# Patient Record
Sex: Female | Born: 1985 | Race: Black or African American | Hispanic: No | Marital: Married | Smoking: Never smoker
Health system: Southern US, Community
[De-identification: ages and names within clinical notes are randomized; demographics above are authoritative.]

---
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
## 2008-02-03 ENCOUNTER — Emergency Department (HOSPITAL_COMMUNITY): Admission: EM | Admit: 2008-02-03 | Discharge: 2008-02-03 | Payer: Self-pay | Admitting: Emergency Medicine

## 2009-08-18 ENCOUNTER — Emergency Department (HOSPITAL_COMMUNITY): Admission: EM | Admit: 2009-08-18 | Discharge: 2009-08-18 | Payer: Self-pay | Admitting: Emergency Medicine

## 2011-03-07 LAB — WET PREP, GENITAL
Trich, Wet Prep: NONE SEEN
Yeast Wet Prep HPF POC: NONE SEEN

## 2011-03-07 LAB — GC/CHLAMYDIA PROBE AMP, GENITAL
Chlamydia, DNA Probe: NEGATIVE
GC Probe Amp, Genital: POSITIVE — AB

## 2011-03-07 LAB — RPR: RPR Ser Ql: NONREACTIVE

## 2011-03-07 LAB — POCT PREGNANCY, URINE: Preg Test, Ur: NEGATIVE

## 2016-12-12 ENCOUNTER — Encounter (HOSPITAL_COMMUNITY): Payer: Self-pay | Admitting: Emergency Medicine

## 2016-12-12 ENCOUNTER — Emergency Department (HOSPITAL_COMMUNITY)
Admission: EM | Admit: 2016-12-12 | Discharge: 2016-12-12 | Disposition: A | Discharge status: Home / Self Care | Payer: Self-pay | Attending: Emergency Medicine | Admitting: Emergency Medicine

## 2016-12-12 DIAGNOSIS — L02411 Cutaneous abscess of right axilla: Secondary | ICD-10-CM | POA: Insufficient documentation

## 2016-12-12 MED ORDER — LIDOCAINE-EPINEPHRINE (PF) 2 %-1:200000 IJ SOLN
10.0000 mL | Freq: Once | INTRAMUSCULAR | Status: AC
  Administered 2016-12-12: 10 mL
  Filled 2016-12-12: qty 20

## 2016-12-12 MED ORDER — IBUPROFEN 400 MG PO TABS
600.0000 mg | ORAL_TABLET | Freq: Once | ORAL | Status: AC
  Administered 2016-12-12: 600 mg via ORAL
  Filled 2016-12-12: qty 1

## 2016-12-12 NOTE — ED Provider Notes (Signed)
[facility]-EMERGENCY DEPT Provider Note   CSN: [id number] Arrival date & time: 12/12/16  1443  By signing my name below, I, Rosana Fretana Waskiewicz, attest that this documentation has been prepared under the direction and in the presence of non-physician practitioner, Russo, [place], PA-C. Electronically Signed: Rosana Fretana Waskiewicz, ED Scribe. 12/12/16. 5:55 PM.  History   Chief Complaint Chief Complaint  Patient presents with  . Abscess   The history is provided by the patient. No language interpreter was used.   HPI Comments: Alice Bean is a 31 y.o. female who presents to the Emergency Department complaining of a moderate, gradually worsening area of pain and swelling to the right axilla onset 2 weeks ago. Pt has a hx of abscesses in the same area. Pt states pain is exacerbated with palpation and direct pressure. Pt denies hx hidradenitis suppurativa. Denies fever, chills, nausea, vomiting purulent drainage from the area.   History reviewed. No pertinent past medical history.  There are no active problems to display for this patient.   History reviewed. No pertinent surgical history.  OB History    No data available       Home Medications    Prior to Admission medications   Not on File    Family History No family history on file.  Social History Social History  Substance Use Topics  . Smoking status: Never Smoker  . Smokeless tobacco: Never Used  . Alcohol use No     Allergies   Patient has no allergy information on record.   Review of Systems Review of Systems  Constitutional: Negative for chills and fever.  Gastrointestinal: Negative for nausea and vomiting.  Skin: Positive for wound.     Physical Exam Updated Vital Signs BP 109/63   Pulse 67   Temp 98.9 F (37.2 C) (Oral)   Resp 16   Ht 5\' 7"  (1.702 m)   Wt 100.5 kg (221 lb 8 oz)   LMP 09/07/2016 Comment: pregnant  SpO2 100%   BMI 34.69 kg/m   Physical Exam  Constitutional: She appears well-developed  and well-nourished. No distress.  Pt is well-appearing.  HENT:  Head: Normocephalic and atraumatic.  Eyes: Conjunctivae are normal.  Cardiovascular: Normal rate.   Pulmonary/Chest: Effort normal.  Neurological: She is alert.  Skin: Skin is warm and dry. No erythema.  3-4 cm fluctuant tender mass in the right axilla. No surrounding erythema or edema. Able to express some purulent drainage.  Psychiatric: She has a normal mood and affect. Her behavior is normal.  Nursing note and vitals reviewed.    ED Treatments / Results  DIAGNOSTIC STUDIES: Oxygen Saturation is 100% on RA, normal by my interpretation.   COORDINATION OF CARE: 5:53 PM-Discussed next steps with pt including U/S. Pt verbalized understanding and is agreeable with the plan.   Labs (all labs ordered are listed, but only abnormal results are displayed) Labs Reviewed - No data to display  EKG  EKG Interpretation None       Radiology No results found.  Procedures EMERGENCY DEPARTMENT US SOFT TISSUE INTERPRETATION "Study: Limited Soft Tissue Ultrasound"  INDICATIONS: Pain and Soft tissue infection Multiple views of the body part were obtained in real-time with a multi-frequency linear probe  PERFORMED BY: Myself IMAGES ARCHIVED?: Yes SIDE:Right  BODY PART:Axilla INTERPRETATION:  Abcess present and No cellulitis noted     .Marland Kitchen.Incision and Drainage Date/Time: 12/12/2016 7:32 PM Performed by: RUSSO, [place]ORDAN N Authorized by: RUSSO, [place]ORD[place] N   Consent:    Consent obtained:  Verbal   Consent given by:  Patient   Risks discussed:  Bleeding, incomplete drainage, infection and pain   Alternatives discussed:  No treatment and delayed treatment Location:    Type:  Abscess   Size:  3 cm   Location:  Upper extremity   Upper extremity location:  Arm   Arm location: right axilla. Pre-procedure details:    Skin preparation:  Chloraprep Anesthesia (see MAR for exact dosages):    Anesthesia method:  Local  infiltration   Local anesthetic:  Lidocaine 2% WITH epi Procedure type:    Complexity:  Complex Procedure details:    Needle aspiration: yes     Needle gauge: 27.   Incision types:  Single straight   Incision depth:  Dermal   Scalpel blade:  11   Wound management:  Probed and deloculated and irrigated with saline   Drainage:  Bloody and purulent   Drainage amount:  Moderate   Wound treatment:  Wound left open   Packing materials:  1/4 in iodoform gauze   Amount 1/4" iodoform:  3 cm Post-procedure details:    Patient tolerance of procedure:  Tolerated well, no immediate complications    (including critical care time)  Medications Ordered in ED Medications  lidocaine-EPINEPHrine (XYLOCAINE W/EPI) 2 %-1:200000 (PF) injection 10 mL (10 mLs Infiltration Given by Other 12/12/16 1936)  ibuprofen (ADVIL,MOTRIN) tablet 600 mg (600 mg Oral Given 12/12/16 1855)     Initial Impression / Assessment and Plan / ED Course  I have reviewed the triage vital signs and the nursing notes.  Pertinent labs & imaging results that were available during my care of the patient were reviewed by me and considered in my medical decision making (see chart for details).    Patient with skin abscess to right axilla. Incision and drainage performed in the ED today.  Abscess was packed due to location in a skin fold. Wound recheck in 2 days. Supportive care and return precautions discussed. Pt is afebrile, nontoxic, not in distress,  safe for discharge.   Discussed results, findings, treatment and follow up. Patient advised of return precautions. Patient verbalized understanding and agreed with plan.  Final Clinical Impressions(s) / ED Diagnoses   Final diagnoses:  Abscess of axilla, right    New Prescriptions There are no discharge medications for this patient.  I personally performed the services described in this documentation, which was scribed in my presence. The recorded information has been  reviewed and is accurate.     Russo, [place] N, PA-C 12/12/16 2000    Vanetta Mulders, MD 12/14/16 [phone]

## 2016-12-12 NOTE — ED Triage Notes (Signed)
Pt. Stated, I have this knot or something under rt, arm for over a month. Ive had an abscess before.

## 2016-12-12 NOTE — Discharge Instructions (Signed)
Please read instructions below.  Keep your wound clean, dry and covered for the first 24 hours.  After the first 24 hours, apply warm compresses and do warm water soaks frequently.  Follow up with urgent care or ER for wound recheck in 2-3 days. You can take advil or tylenol as needed for pain. Return to the ER for fever, redness, or new or worsening symptoms.

## 2017-01-18 ENCOUNTER — Encounter (HOSPITAL_COMMUNITY): Payer: Self-pay

## 2017-01-18 ENCOUNTER — Emergency Department (HOSPITAL_COMMUNITY): Payer: Self-pay

## 2017-01-18 ENCOUNTER — Emergency Department (HOSPITAL_COMMUNITY)
Admission: EM | Admit: 2017-01-18 | Discharge: 2017-01-18 | Disposition: A | Discharge status: Home / Self Care | Payer: Self-pay | Attending: Emergency Medicine | Admitting: Emergency Medicine

## 2017-01-18 DIAGNOSIS — Y998 Other external cause status: Secondary | ICD-10-CM | POA: Insufficient documentation

## 2017-01-18 DIAGNOSIS — Z3A19 19 weeks gestation of pregnancy: Secondary | ICD-10-CM | POA: Insufficient documentation

## 2017-01-18 DIAGNOSIS — W19XXXA Unspecified fall, initial encounter: Secondary | ICD-10-CM

## 2017-01-18 DIAGNOSIS — Y9389 Activity, other specified: Secondary | ICD-10-CM | POA: Insufficient documentation

## 2017-01-18 DIAGNOSIS — S8001XA Contusion of right knee, initial encounter: Secondary | ICD-10-CM | POA: Insufficient documentation

## 2017-01-18 DIAGNOSIS — S8011XA Contusion of right lower leg, initial encounter: Secondary | ICD-10-CM | POA: Insufficient documentation

## 2017-01-18 DIAGNOSIS — W010XXA Fall on same level from slipping, tripping and stumbling without subsequent striking against object, initial encounter: Secondary | ICD-10-CM | POA: Insufficient documentation

## 2017-01-18 DIAGNOSIS — O9A212 Injury, poisoning and certain other consequences of external causes complicating pregnancy, second trimester: Secondary | ICD-10-CM | POA: Insufficient documentation

## 2017-01-18 DIAGNOSIS — Y92009 Unspecified place in unspecified non-institutional (private) residence as the place of occurrence of the external cause: Secondary | ICD-10-CM | POA: Insufficient documentation

## 2017-01-18 NOTE — ED Notes (Signed)
Pt departed in NAD, refused use of wheelchair.  

## 2017-01-18 NOTE — Discharge Instructions (Signed)
It was my pleasure taking care of you today!   Ice affected area as needed for pain. If you need additional pain control, you can take Tylenol.  Keep your follow up appointment with OBGYN.   Return to ER for new or worsening symptoms, any additional concerns.

## 2017-01-18 NOTE — ED Provider Notes (Signed)
[facility] Provider Note   CSN: [id number] Arrival date & time: 01/18/17  1248     History   Chief Complaint Chief Complaint  Patient presents with  . Fall  . Pregnant, 19 weeks    HPI Alice Bean is a 31 y.o. female.  The history is provided by the patient and medical records. No language interpreter was used.  Fall    Alice Bean is a 31 y.o. female who presents to the Emergency Department complaining of right leg pain which began acutely this morning. Patient states that she woke up around 4 am this morning to feed her child and got her right foot stuck in a vent, causing her to fall forward, hitting her right lower leg and right knee. She hit lower extremity first, but then did land on abdomen. Of note, patient is [redacted] weeks pregnant. Thus far, pregnancy with no complications and going smoothly. She is from [place] and only in town for a few days visiting grandparents. She has follow up appointment next week with OBGYN already scheduled. She has small scratch to RLE as well. Tetanus is up to date. 800 mg ibuprofen taken prior to arrival which alleviated pain. No other meds/treatments. Able to walk on the leg, but does exacerbate pain, therefore she has been limping. No prior injuries to RLE. No LOC or head injury. No abdominal pain, nausea, vomiting, vaginal discharge, bleeding, numbness, tingling.      History reviewed. No pertinent past medical history.  There are no active problems to display for this patient.   History reviewed. No pertinent surgical history.  OB History    Gravida Para Term Preterm AB Living   1             SAB TAB Ectopic Multiple Live Births                   Home Medications    Prior to Admission medications   Medication Sig Start Date End Date Taking? Authorizing Provider  ibuprofen (ADVIL,MOTRIN) 800 MG tablet Take 800 mg by mouth every 8 (eight) hours as needed (pain).   Yes [provider]    Family History No  family history on file.  Social History Social History  Substance Use Topics  . Smoking status: Never Smoker  . Smokeless tobacco: Never Used  . Alcohol use No     Allergies   Patient has no known allergies.   Review of Systems Review of Systems  Musculoskeletal: Positive for arthralgias and myalgias.  Skin: Positive for wound.  All other systems reviewed and are negative.    Physical Exam Updated Vital Signs BP 123/78   Pulse 86   Temp 98.3 F (36.8 C) (Oral)   Resp 18   Ht 5\' 7"  (1.702 m)   Wt 99.8 kg (220 lb)   LMP 09/07/2016 Comment: pregnant  SpO2 99%   BMI 34.46 kg/m   Physical Exam  Constitutional: She is oriented to person, place, and time. She appears well-developed and well-nourished. No distress.  HENT:  Head: Normocephalic and atraumatic.  Cardiovascular: Normal rate, regular rhythm and normal heart sounds.   No murmur heard. Pulmonary/Chest: Effort normal and breath sounds normal. No respiratory distress.  Abdominal: Soft. She exhibits no distension.  No abdominal tenderness. No bruising or signs of trauma.   Musculoskeletal:  Right knee with tenderness to palpation along patellar tendon and medial joint line. Ligaments intact. Negative McMurray's. Tenderness along lateral aspect of RLE as well. No  ankle tenderness. Full ROM and 5/5 muscle strength of RLE.  Neurological: She is alert and oriented to person, place, and time.  Bilateral lower extremities neurovascularly intact.  Skin: Skin is warm and dry.  Nursing note and vitals reviewed.    ED Treatments / Results  Labs (all labs ordered are listed, but only abnormal results are displayed) Labs Reviewed - No data to display  EKG  EKG Interpretation None       Radiology Dg Tibia/fibula Right  Result Date: 01/18/2017 CLINICAL DATA:  Right lower leg pain after fall tonight. Pregnant patient in second trimester pregnancy, shielded. EXAM: RIGHT TIBIA AND FIBULA - 2 VIEW COMPARISON:  None.  FINDINGS: There is no evidence of fracture or other focal bone lesions. Ankle alignment is maintained. Soft tissue edema anteriorly. IMPRESSION: No acute fracture.  Soft tissue edema. Electronically Signed   By: Rubye Oaks M.D.   On: 01/18/2017 23:07   Dg Knee Complete 4 Views Right  Result Date: 01/18/2017 CLINICAL DATA:  Fall tonight with right knee pain. Pregnant patient in second trimester pregnancy, shielded. EXAM: RIGHT KNEE - COMPLETE 4+ VIEW COMPARISON:  None. FINDINGS: No evidence of fracture, dislocation, or joint effusion. No evidence of arthropathy or other focal bone abnormality. Soft tissues are unremarkable. IMPRESSION: Negative radiographs of the right knee. Electronically Signed   By: Rubye Oaks M.D.   On: 01/18/2017 23:07    Procedures Procedures (including critical care time)  Medications Ordered in ED Medications - No data to display   Initial Impression / Assessment and Plan / ED Course  I have reviewed the triage vital signs and the nursing notes.  Pertinent labs & imaging results that were available during my care of the patient were reviewed by me and considered in my medical decision making (see chart for details).    Alice Bean is a 31 y.o. female who presents to ED for evaluation after fall earlier today. Patient is [redacted] weeks pregnant. She had a mechanical fall, landing on right knee then landing on abdomen. No abdominal pain, cramping, vaginal bleeding. No abdominal tenderness or bruising. Fetal heart tones of 145. She has an OBGYN appointment next week and was encouraged to keep scheduled appointment. She took one dose of ibuprofen for pain prior to arrival. Discussed only to use Tylenol for analgesia throughout pregnancy. RLE NVI. X-ray obtained and negative. Patient ambulatory without assistance. Symptomatic home care again discussed. All questions answered.    Final Clinical Impressions(s) / ED Diagnoses   Final diagnoses:  Fall, initial  encounter  Contusion of right knee and lower leg, initial encounter    New Prescriptions New Prescriptions   No medications on file     Eiza Canniff, Chase Picket, PA-C 01/18/17 2339    Charlynne Pander, MD 01/21/17 [phone]

## 2017-01-18 NOTE — ED Notes (Signed)
Patient transported to X-ray 

## 2017-01-18 NOTE — ED Triage Notes (Signed)
Pt presents to the ed with complaints of falling this morning after tripping over something in her house, complaints of right lower leg pain. Pt is [redacted] weeks pregnant. Reports landing on her stomach, denies abdominal pain or bleeding.

## 2017-01-18 NOTE — ED Notes (Signed)
Fetal Heart Tones 145 done by [facility]

## 2019-01-20 IMAGING — CR DG KNEE COMPLETE 4+V*R*
4 series · 4 of 4 positions shown · non-contrast
Comparison: None.

CLINICAL DATA: Fall tonight with right knee pain. Pregnant patient
in second trimester pregnancy, shielded.

EXAM:
RIGHT KNEE - COMPLETE 4+ VIEW

[knee ap]
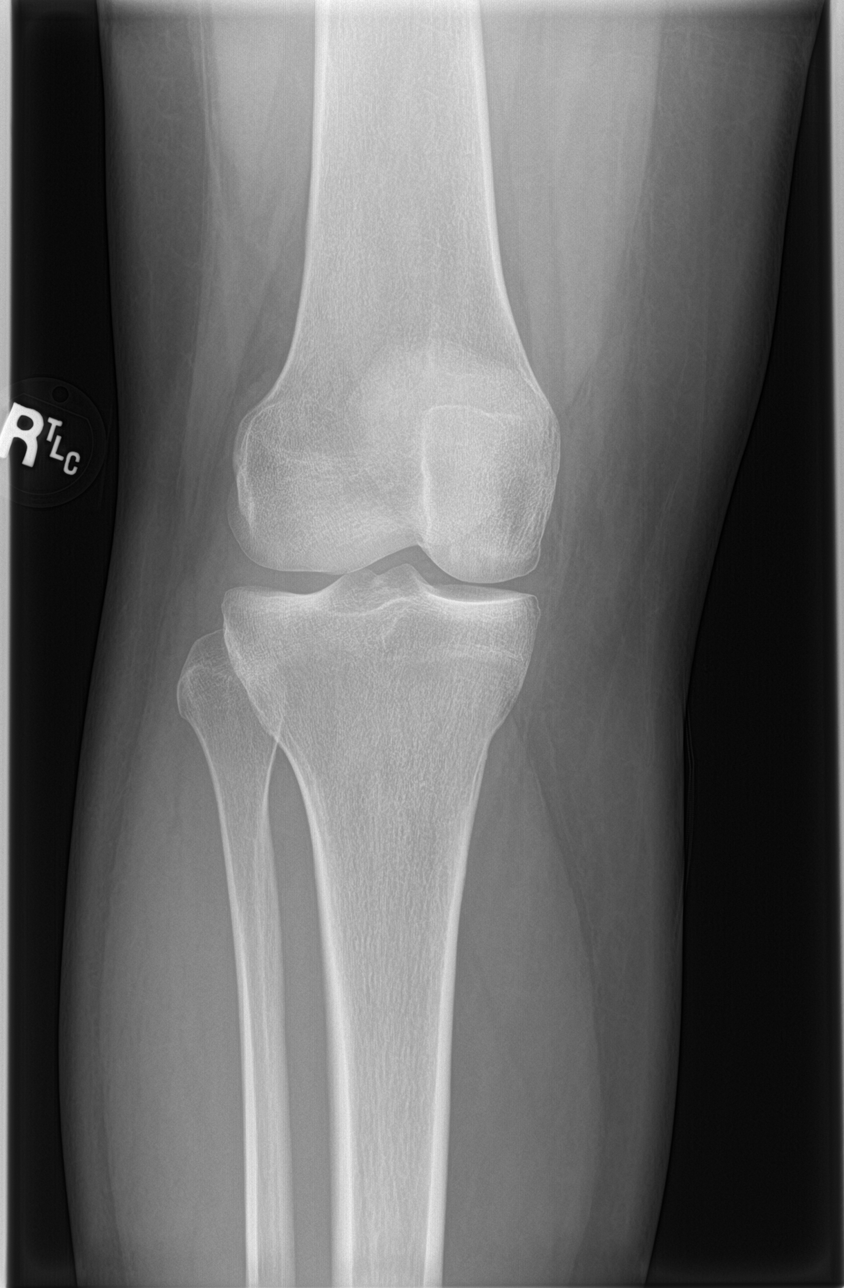

[knee lat]
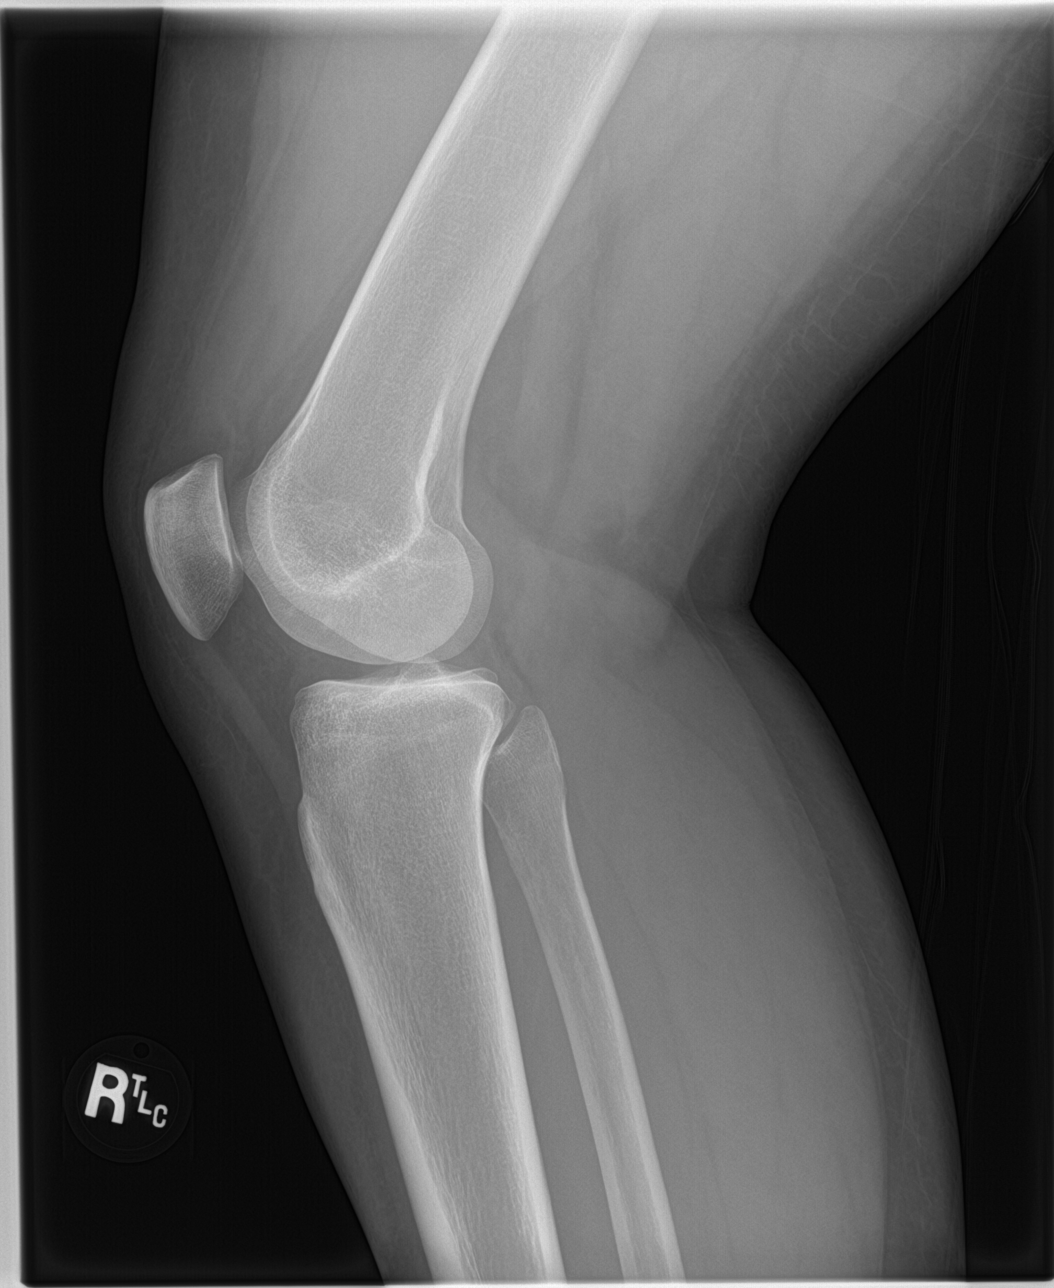

[knee obl (1 of 2)]
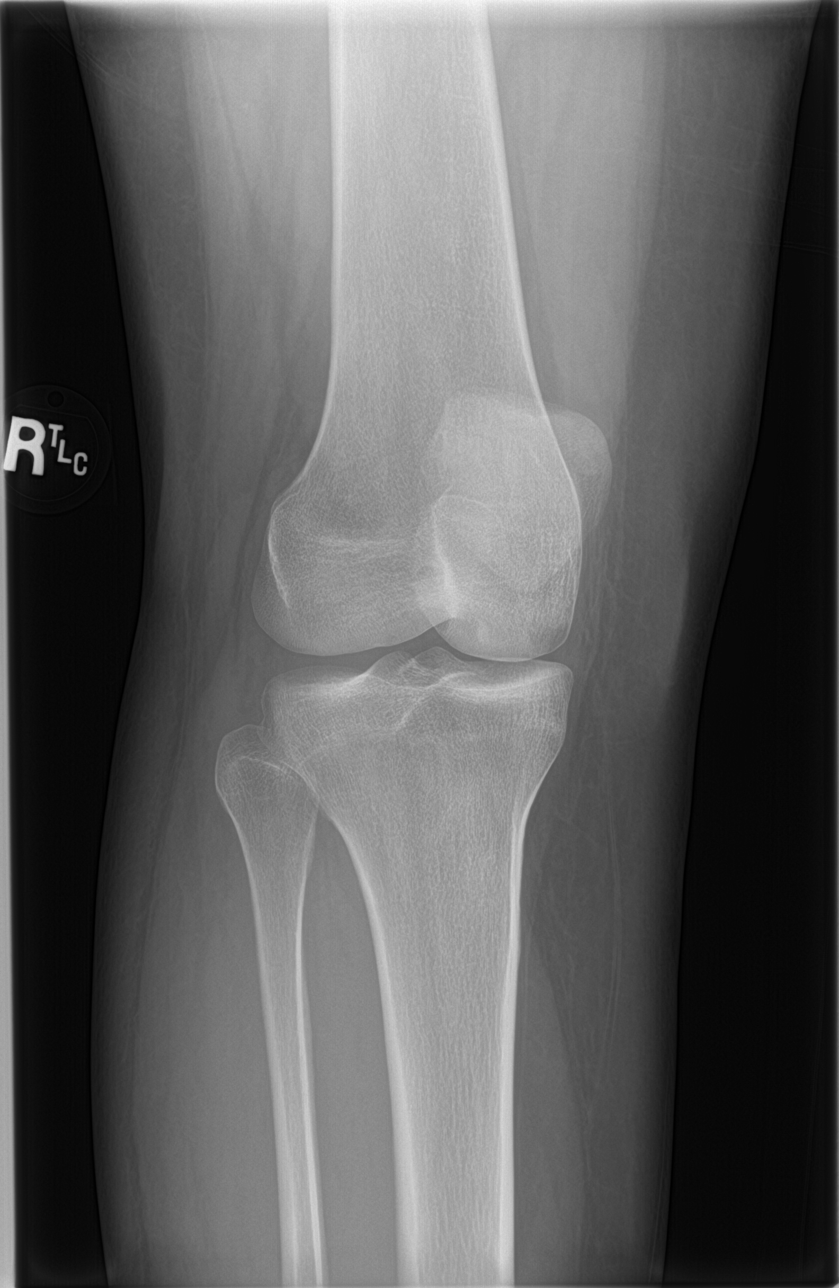

[knee obl (2 of 2)]
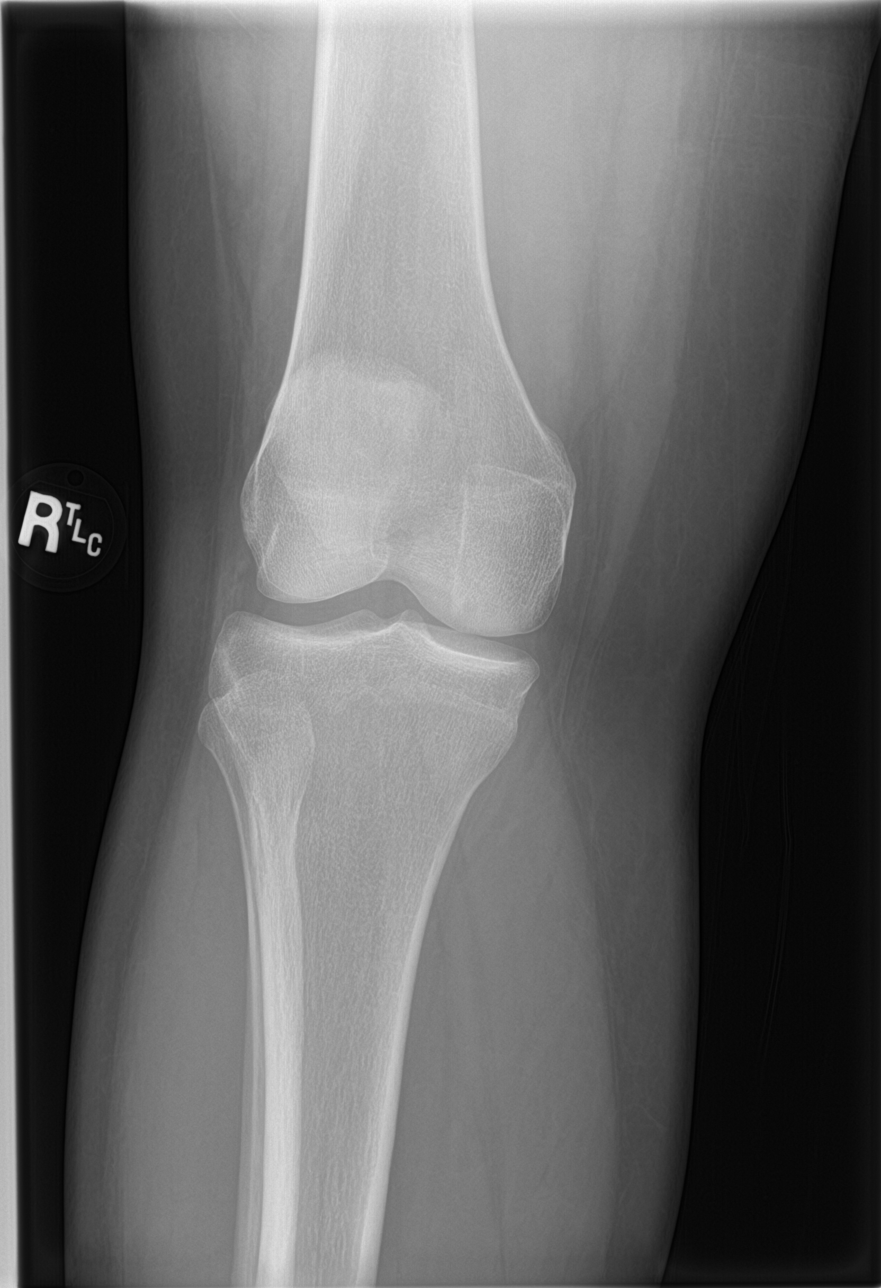

[4 of 4 positions shown; findings below may reference images not displayed]

FINDINGS: No evidence of fracture, dislocation, or joint effusion. No evidence
of arthropathy or other focal bone abnormality. Soft tissues are
unremarkable.
IMPRESSION: Negative radiographs of the right knee.
